# Patient Record
Sex: Male | Born: 2000 | Race: White | Hispanic: No | Marital: Single | Smoking: Current every day smoker
Health system: Southern US, Community
[De-identification: ages and names within clinical notes are randomized; demographics above are authoritative.]

---
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
## 2020-04-24 DIAGNOSIS — M545 Low back pain, unspecified: Secondary | ICD-10-CM | POA: Diagnosis not present

## 2021-05-09 DIAGNOSIS — Z202 Contact with and (suspected) exposure to infections with a predominantly sexual mode of transmission: Secondary | ICD-10-CM | POA: Diagnosis not present

## 2021-11-08 ENCOUNTER — Ambulatory Visit (INDEPENDENT_AMBULATORY_CARE_PROVIDER_SITE_OTHER): Payer: Medicaid Other

## 2021-11-08 ENCOUNTER — Encounter: Payer: Self-pay | Admitting: Orthopedic Surgery

## 2021-11-08 ENCOUNTER — Ambulatory Visit (INDEPENDENT_AMBULATORY_CARE_PROVIDER_SITE_OTHER): Payer: Medicaid Other | Admitting: Orthopedic Surgery

## 2021-11-08 VITALS — BP 137/75 | HR 80 | Ht 68.0 in | Wt 248.0 lb

## 2021-11-08 DIAGNOSIS — G8929 Other chronic pain: Secondary | ICD-10-CM

## 2021-11-08 DIAGNOSIS — M5441 Lumbago with sciatica, right side: Secondary | ICD-10-CM

## 2021-11-08 MED ORDER — CELECOXIB 100 MG PO CAPS: 100.0000 mg | ORAL_CAPSULE | Freq: Two times a day (BID) | ORAL | 0 refills | Status: DC

## 2021-11-08 MED ORDER — CYCLOBENZAPRINE HCL 10 MG PO TABS: 10.0000 mg | ORAL_TABLET | Freq: Two times a day (BID) | ORAL | 0 refills | Status: DC | PRN
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
## 2022-03-09 ENCOUNTER — Encounter: Payer: Self-pay | Admitting: Radiology

## 2022-03-22 NOTE — Progress Notes (Signed)
New patient visit   Patient: Harry Clark   DOB: [birth year]   22 y.o. Male  MRN: [id number] Visit Date: 03/23/2022  Today's healthcare provider: Mardene Speak, PA-C  CC: establish care, weight management and get tested for DM  Subjective    Harry D Cerullo is a 22 y.o. male who presents today as a new patient to establish care.   HPI   Establish-weight management, get  tested diabetes. Last edited by Elta Guadeloupe, CMA on 03/23/2022  2:03 PM.      Obesity: Patient complains of obesity. Patient cites health as reasons for wanting to lose weight.  Obesity History Weight in late teens: 180 lb. Period of greatest weight gain: 264 lb during 2-3 years, Lowest adult weight: 180 Highest adult weight: 264   History of Weight Loss Efforts Greatest amount of weight lost: 10 lb over a few months Amount of time that loss was maintained: a few months Circumstances associated with regain of weight: big appetite  Current Exercise Habits no regular exercise  Current Eating Habits Number of regular meals per day: 3 Number of snacking episodes per day: 2  Who prepares food? patient and his wife Binge behavior?: no Purge behavior? no Anorexic behavior? no Pt loves to eat. Eats larger portions than his wife.   Other Potential Contributing Factors Use of alcohol: average 7-8 drinks/week Use of medications that may cause weight gain none History of past abuse? none Psych History: denies Comorbidities: none  History reviewed. No pertinent past medical history. History reviewed. No pertinent surgical history. Family Status  Relation Name Status   Mother  Alive   Father  Alive   MGM  Alive   Family History  Problem Relation Age of Onset   Hypertension Mother    Diabetes Mother    Hyperlipidemia Father    Hypertension Father    Hyperlipidemia Maternal Grandmother    Hypertension Maternal Grandmother    Diabetes Maternal Grandmother    Social History   Socioeconomic  History   Marital status: Single    Spouse name: Not on file   Number of children: Not on file   Years of education: Not on file   Highest education level: Not on file  Occupational History   Not on file  Tobacco Use   Smoking status: Every Day    Types: E-cigarettes   Smokeless tobacco: Never  Vaping Use   Vaping Use: Every day  Substance and Sexual Activity   Alcohol use: Yes    Comment: weekends   Drug use: Never   Sexual activity: Not on file  Other Topics Concern   Not on file  Social History Narrative   Not on file   Social Determinants of Health   Financial Resource Strain: Not on file  Food Insecurity: Not on file  Transportation Needs: Not on file  Physical Activity: Not on file  Stress: Not on file  Social Connections: Not on file   Outpatient Medications Prior to Visit  Medication Sig   [DISCONTINUED] celecoxib (CELEBREX) 100 MG capsule Take 1 capsule (100 mg total) by mouth 2 (two) times daily.   [DISCONTINUED] cyclobenzaprine (FLEXERIL) 10 MG tablet Take 1 tablet (10 mg total) by mouth 2 (two) times daily as needed for muscle spasms.   No facility-administered medications prior to visit.   No Known Allergies   There is no immunization history on file for this patient.  Health Maintenance  Topic Date Due   COVID-19 Vaccine (  1) Never done   HPV VACCINES (1 - Male 2-dose series) Never done   HIV Screening  Never done   Hepatitis C Screening  Never done   DTaP/Tdap/Td (1 - Tdap) Never done   INFLUENZA VACCINE  04/09/2022 (Originally 08/09/2021)    Patient Care Team: Mardene Speak, PA-C as PCP - General (Physician Assistant)  Review of Systems  Eyes:  Negative for visual disturbance.  Endocrine: Positive for polydipsia, polyphagia and polyuria.  Neurological:  Negative for numbness.       Objective    BP 114/78 (BP Location: Left Arm, Patient Position: Sitting, Cuff Size: Large)   Pulse 88   Temp 98 F (36.7 C)   Ht 5\' 8"  (1.727 m)   Wt  264 lb (119.7 kg)   SpO2 100%   BMI 40.14 kg/m    Physical Exam Vitals reviewed.  Constitutional:      General: He is not in acute distress.    Appearance: Normal appearance. He is not diaphoretic.  HENT:     Head: Normocephalic and atraumatic.  Eyes:     General: No scleral icterus.    Conjunctiva/sclera: Conjunctivae normal.  Cardiovascular:     Rate and Rhythm: Normal rate and regular rhythm.     Pulses: Normal pulses.     Heart sounds: Normal heart sounds. No murmur heard. Pulmonary:     Effort: Pulmonary effort is normal. No respiratory distress.     Breath sounds: Normal breath sounds. No wheezing or rhonchi.  Musculoskeletal:     Cervical back: Neck supple.     Right lower leg: No edema.     Left lower leg: No edema.  Lymphadenopathy:     Cervical: No cervical adenopathy.  Skin:    General: Skin is warm and dry.     Findings: No rash.  Neurological:     Mental Status: He is alert and oriented to person, place, and time. Mental status is at baseline.  Psychiatric:        Mood and Affect: Mood normal.        Behavior: Behavior normal.    Depression Screen    03/23/2022    2:11 PM  PHQ 2/9 Scores  PHQ - 2 Score 0  PHQ- 9 Score 0   No results found for any visits on 03/23/22.  Assessment & Plan      Morbid obesity ([place]) Chronic and his current BMI 40.14 Pt was advised to do initial workup  but he declined as he is afraid of needles. However, he agreed to proceed with POCT test for DMII as he has a positive family hx for DMII - POCT HgB A1C Pt needs to proceed with strict low-carb, low-fat diet and daily exercise. Weight loss of 5% of pt's current weight via healthy diet and daily exercise encouraged.  Advised cut down on alcohol intake.  Vaping Advised vaping cessation Will revisit at his next appt  Polydipsia Polyphagia Polyuria Family history of diabetes mellitus (DM) New problem Pt is very concerns about having DMII due to positive family hx  for DMII Requested to be checked  A1C was 5.5 Advised low-carb diet and daily exercise ADA site was provided for information on DMII   Return in about 6 weeks (around 05/04/2022) for CPE.     The patient was advised to call back or seek an in-person evaluation if the symptoms worsen or if the condition fails to improve as anticipated.  I discussed the assessment and treatment plan  with the patient. The patient was provided an opportunity to ask questions and all were answered. The patient agreed with the plan and demonstrated an understanding of the instructions.  I, Mardene Speak, PA-C have reviewed all documentation for this visit. The documentation on  02/22/22  for the exam, diagnosis, procedures, and orders are all accurate and complete.  Mardene Speak, [facility], [place] [phone] (phone) [phone] (fax)   [place]

## 2022-03-23 ENCOUNTER — Ambulatory Visit (INDEPENDENT_AMBULATORY_CARE_PROVIDER_SITE_OTHER): Payer: Medicaid Other | Admitting: Physician Assistant

## 2022-03-23 ENCOUNTER — Encounter: Payer: Self-pay | Admitting: Physician Assistant

## 2022-03-23 DIAGNOSIS — R632 Polyphagia: Secondary | ICD-10-CM

## 2022-03-23 DIAGNOSIS — Z7689 Persons encountering health services in other specified circumstances: Secondary | ICD-10-CM

## 2022-03-23 DIAGNOSIS — R631 Polydipsia: Secondary | ICD-10-CM

## 2022-03-23 DIAGNOSIS — R3589 Other polyuria: Secondary | ICD-10-CM

## 2022-03-23 DIAGNOSIS — Z833 Family history of diabetes mellitus: Secondary | ICD-10-CM

## 2022-03-25 DIAGNOSIS — Z833 Family history of diabetes mellitus: Secondary | ICD-10-CM | POA: Insufficient documentation

## 2022-03-25 DIAGNOSIS — Z7689 Persons encountering health services in other specified circumstances: Secondary | ICD-10-CM | POA: Insufficient documentation

## 2022-03-25 LAB — POCT GLYCOSYLATED HEMOGLOBIN (HGB A1C): Hemoglobin A1C: 5.5 % (ref 4.0–5.6)

## 2022-05-04 ENCOUNTER — Encounter: Payer: Medicaid Other | Admitting: Physician Assistant

## 2022-06-08 ENCOUNTER — Ambulatory Visit: Payer: Medicaid Other | Admitting: Physician Assistant

## 2022-09-19 ENCOUNTER — Other Ambulatory Visit: Payer: Self-pay

## 2022-09-19 ENCOUNTER — Emergency Department
Admission: EM | Admit: 2022-09-19 | Discharge: 2022-09-19 | Disposition: A | Discharge status: Home / Self Care | Payer: Medicaid Other | Attending: Emergency Medicine | Admitting: Emergency Medicine

## 2022-09-19 ENCOUNTER — Emergency Department: Payer: Medicaid Other

## 2022-09-19 DIAGNOSIS — S99921A Unspecified injury of right foot, initial encounter: Secondary | ICD-10-CM | POA: Diagnosis present

## 2022-09-19 DIAGNOSIS — W228XXA Striking against or struck by other objects, initial encounter: Secondary | ICD-10-CM | POA: Insufficient documentation

## 2022-09-19 DIAGNOSIS — S90111A Contusion of right great toe without damage to nail, initial encounter: Secondary | ICD-10-CM | POA: Diagnosis not present

## 2022-09-19 DIAGNOSIS — S90211A Contusion of right great toe with damage to nail, initial encounter: Secondary | ICD-10-CM | POA: Insufficient documentation

## 2022-09-19 MED ORDER — AMOXICILLIN-POT CLAVULANATE 875-125 MG PO TABS: 1.0000 | ORAL_TABLET | Freq: Two times a day (BID) | ORAL | 0 refills | Status: AC

## 2022-09-19 NOTE — ED Triage Notes (Signed)
Pt to ED via POV c/o right big toe injury. Pt says he stubbed toe on wood 2 hrs ago. Having some swelling and bleeding under nail

## 2022-09-19 NOTE — ED Provider Notes (Signed)
[place] EMERGENCY DEPARTMENT AT [facility] Provider Note   CSN: [id number] Arrival date & time: 09/19/22  2045     History  Chief Complaint  Patient presents with   Toe Injury    Harry Clark is a 22 y.o. male.  With no past medical history presents to the emergency department valuation of right great toe injury.  Patient stopped his right great toe on a wooden wall 2 hours ago.  Having quite a bit of pain along the medial border of the great toe distally.  Little bit of redness and swelling.  History of ingrown toenail but denies any prior swelling redness or drainage to the nail.  Has had a little bloody drainage on the tip of the nail but no subungual hematoma/bruising or pain under the nailbed at this time.  HPI     Home Medications Prior to Admission medications   Medication Sig Start Date End Date Taking? Authorizing Provider  amoxicillin-clavulanate (AUGMENTIN) 875-125 MG tablet Take 1 tablet by mouth 2 (two) times daily for 10 days. 09/19/22 09/29/22 Yes Evon Slack, PA-C      Allergies    Patient has no known allergies.    Review of Systems   Review of Systems  Physical Exam Updated Vital Signs BP 132/84 (BP Location: Left Arm)   Pulse 83   Temp 98.1 F (36.7 C) (Oral)   Resp 16   Ht 5\' 8"  (1.727 m)   Wt 113.4 kg   SpO2 98%   BMI 38.01 kg/m  Physical Exam Constitutional:      Appearance: He is well-developed.  HENT:     Head: Normocephalic and atraumatic.  Eyes:     Conjunctiva/sclera: Conjunctivae normal.  Cardiovascular:     Rate and Rhythm: Normal rate.  Pulmonary:     Effort: Pulmonary effort is normal. No respiratory distress.  Musculoskeletal:        General: Normal range of motion.     Cervical back: Normal range of motion.     Comments: Tender along the tip of the right great toe.  Patient appears to have ingrown toenail but no signs of any infectious process.  There is a small amount of blood along the medial corner of  the nail distally.  No subungual hematoma.  Mild tenderness to the tip of the toe.  No sign of any infectious process.  Skin:    General: Skin is warm.     Findings: No rash.  Neurological:     Mental Status: He is alert and oriented to person, place, and time.  Psychiatric:        Behavior: Behavior normal.        Thought Content: Thought content normal.     ED Results / Procedures / Treatments   Labs (all labs ordered are listed, but only abnormal results are displayed) Labs Reviewed - No data to display  EKG None  Radiology DG Toe Great Right  Result Date: 09/19/2022 CLINICAL DATA:  Toe injury EXAM: RIGHT GREAT TOE COMPARISON:  None Available. FINDINGS: There is no evidence of fracture or dislocation. There is no evidence of arthropathy or other focal bone abnormality. Soft tissues are unremarkable. IMPRESSION: Negative. Electronically Signed   By: Darliss Cheney M.D.   On: 09/19/2022 22:14    Procedures Procedures    Medications Ordered in ED Medications - No data to display  ED Course/ Medical Decision Making/ A&P  Medical Decision Making Amount and/or Complexity of Data Reviewed Radiology: ordered.  Risk Prescription drug management.   22 year old male with right great toe injury from trauma.  X-rays negative for any acute bony abnormality.  Nail appears to be partially grown into the soft tissue but no signs of any infectious process.  Was not having any pain prior to the trauma.  Patient is given prescription for Augmentin to take if he starts developing any redness, purulent drainage or signs of any infected ingrown toenail.  In the meantime, patient will treat with Tylenol and ibuprofen rest ice and elevation.  He is given follow-up information to see podiatry if pain persist along with any swelling warmth redness or drainage Final Clinical Impression(s) / ED Diagnoses Final diagnoses:  Contusion of right great toe with damage to  nail, initial encounter    Rx / DC Orders ED Discharge Orders          Ordered    amoxicillin-clavulanate (AUGMENTIN) 875-125 MG tablet  2 times daily        09/19/22 2230              Ronnette Juniper 09/19/22 2234    Corena Herter, MD 09/20/22 0008

## 2022-09-19 NOTE — Discharge Instructions (Addendum)
Please rest ice and elevate the right foot.  Take Tylenol and ibuprofen as needed for pain.  If any increasing pain swelling warmth or redness start antibiotic.  Follow-up with podiatry if you develop any worsening symptoms or increasing pain or no improvement with antibiotics.
